# Patient Record
Sex: Male | Born: 1940 | Smoking: Never smoker
Health system: Southern US, Community
[De-identification: ages and names within clinical notes are randomized; demographics above are authoritative.]

## PROBLEM LIST (undated history)

## (undated) DIAGNOSIS — I10 Essential (primary) hypertension: Secondary | ICD-10-CM

---

## 2009-04-05 ENCOUNTER — Emergency Department: Payer: Self-pay | Admitting: Emergency Medicine

## 2013-07-11 ENCOUNTER — Ambulatory Visit: Payer: Self-pay | Admitting: Urology

## 2013-12-15 ENCOUNTER — Ambulatory Visit: Payer: Self-pay | Admitting: Urology

## 2013-12-15 LAB — BASIC METABOLIC PANEL
Anion Gap: 6 — ABNORMAL LOW (ref 7–16)
BUN: 15 mg/dL (ref 7–18)
CHLORIDE: 98 mmol/L (ref 98–107)
CO2: 28 mmol/L (ref 21–32)
Calcium, Total: 9.4 mg/dL (ref 8.5–10.1)
Creatinine: 0.79 mg/dL (ref 0.60–1.30)
EGFR (Non-African Amer.): >60
Glucose: 101 mg/dL — ABNORMAL HIGH (ref 65–99)
Osmolality: 265 (ref 275–301)
Potassium: 3.7 mmol/L (ref 3.5–5.1)
Sodium: 132 mmol/L — ABNORMAL LOW (ref 136–145)

## 2013-12-29 ENCOUNTER — Ambulatory Visit: Payer: Self-pay | Admitting: Urology

## 2013-12-30 ENCOUNTER — Emergency Department: Payer: Self-pay | Admitting: Emergency Medicine

## 2013-12-30 LAB — COMPREHENSIVE METABOLIC PANEL
Albumin: 3.6 g/dL (ref 3.4–5.0)
Alkaline Phosphatase: 68 U/L
Anion Gap: 11 (ref 7–16)
BUN: 16 mg/dL (ref 7–18)
Bilirubin,Total: 0.4 mg/dL (ref 0.2–1.0)
CREATININE: 1.22 mg/dL (ref 0.60–1.30)
Calcium, Total: 9.1 mg/dL (ref 8.5–10.1)
Chloride: 103 mmol/L (ref 98–107)
Co2: 24 mmol/L (ref 21–32)
EGFR (African American): >60
Glucose: 105 mg/dL — ABNORMAL HIGH (ref 65–99)
OSMOLALITY: 277 (ref 275–301)
Potassium: 3.4 mmol/L — ABNORMAL LOW (ref 3.5–5.1)
SGOT(AST): 27 U/L (ref 15–37)
SGPT (ALT): 25 U/L (ref 12–78)
Sodium: 138 mmol/L (ref 136–145)
TOTAL PROTEIN: 7.8 g/dL (ref 6.4–8.2)

## 2013-12-30 LAB — CBC
HCT: 42.3 % (ref 40.0–52.0)
HGB: 13.5 g/dL (ref 13.0–18.0)
MCH: 26.9 pg (ref 26.0–34.0)
MCHC: 31.9 g/dL — ABNORMAL LOW (ref 32.0–36.0)
MCV: 84 fL (ref 80–100)
PLATELETS: 201 10*3/uL (ref 150–440)
RBC: 5.02 10*6/uL (ref 4.40–5.90)
RDW: 15.8 % — ABNORMAL HIGH (ref 11.5–14.5)
WBC: 3.5 10*3/uL — ABNORMAL LOW (ref 3.8–10.6)

## 2013-12-30 LAB — URINALYSIS, COMPLETE
Bilirubin,UR: NEGATIVE
Glucose,UR: NEGATIVE mg/dL (ref 0–75)
Nitrite: NEGATIVE
PROTEIN: NEGATIVE
Ph: 5 (ref 4.5–8.0)
Specific Gravity: 1.018 (ref 1.003–1.030)
Squamous Epithelial: NONE SEEN

## 2013-12-30 LAB — TROPONIN I: TROPONIN-I: 0.04 ng/mL

## 2013-12-30 LAB — PROTIME-INR
INR: 1
Prothrombin Time: 12.6 secs (ref 11.5–14.7)

## 2013-12-30 LAB — APTT: Activated PTT: 27.9 secs (ref 23.6–35.9)

## 2013-12-30 LAB — LIPASE, BLOOD: Lipase: 113 U/L (ref 73–393)

## 2014-08-12 IMAGING — CT CT ABD-PELV W/ CM
2 of 5 series · 15 of 46 positions shown, 17 images · IV contrast (isovue)
Comparison: CT of the abdomen and pelvis performed 07/11/2013

CLINICAL DATA: Near syncope. Hypotension. Epigastric abdominal
pain.

EXAM:
CT ABDOMEN AND PELVIS WITH CONTRAST
TECHNIQUE: Multidetector CT imaging of the abdomen and pelvis was performed
using the standard protocol following bolus administration of
intravenous contrast.
CONTRAST:  100 mL of Isovue 300 IV contrast

[Series 2: routine abd pel with · axial · 0.81mm/px · z∈[-590,-146]mm · 12 of 101 slices shown, 14 images]
[im 6/101  soft-tissue]
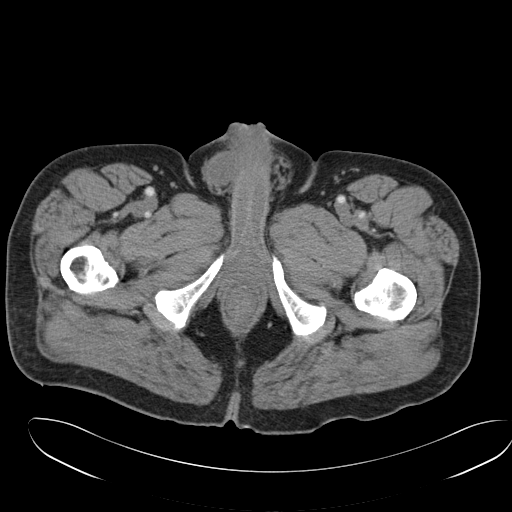
[im 6/101  bone]
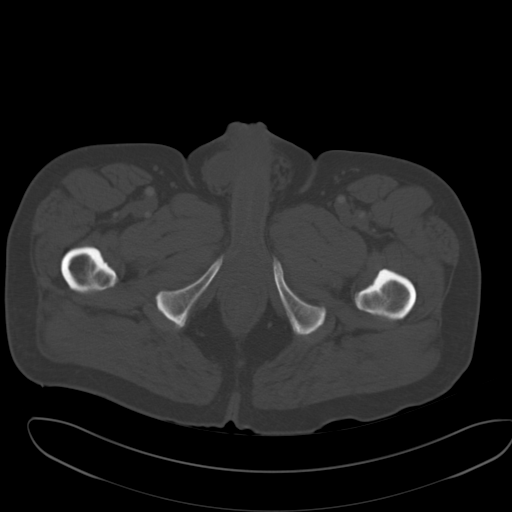
[im 18/101  soft-tissue]
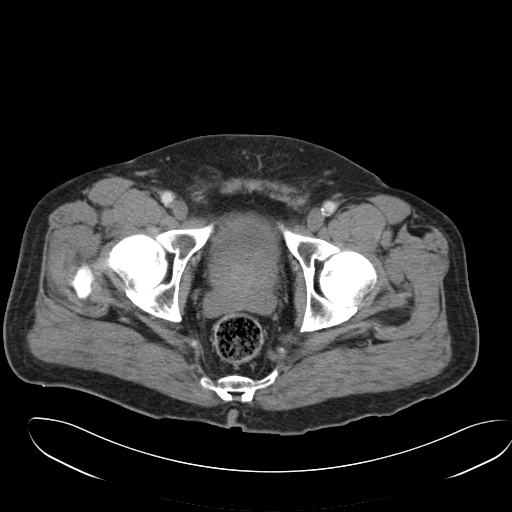
[im 24/101  soft-tissue]
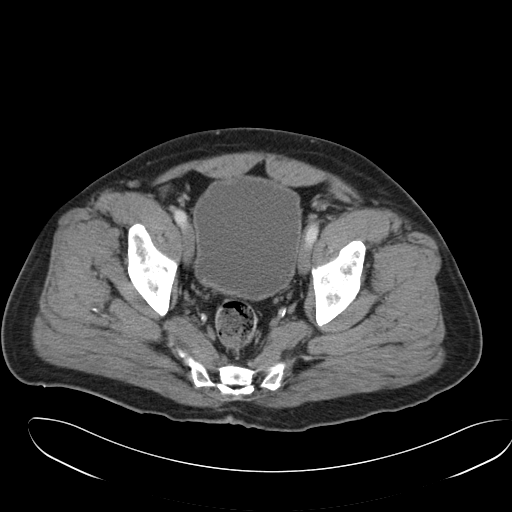
[im 30/101  soft-tissue]
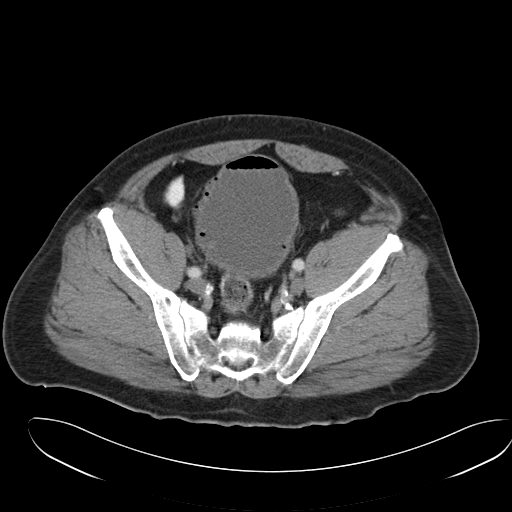
[im 42/101  soft-tissue]
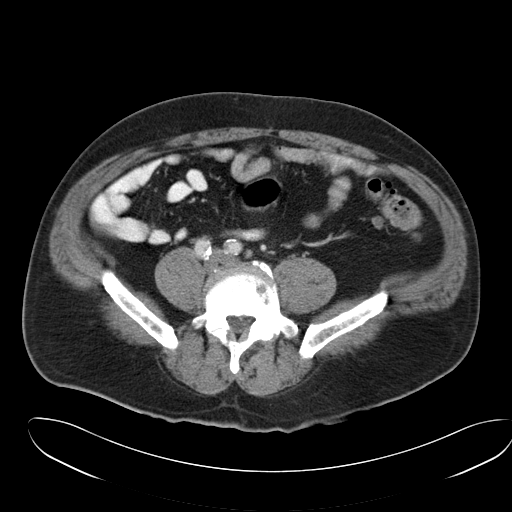
[im 48/101  soft-tissue]
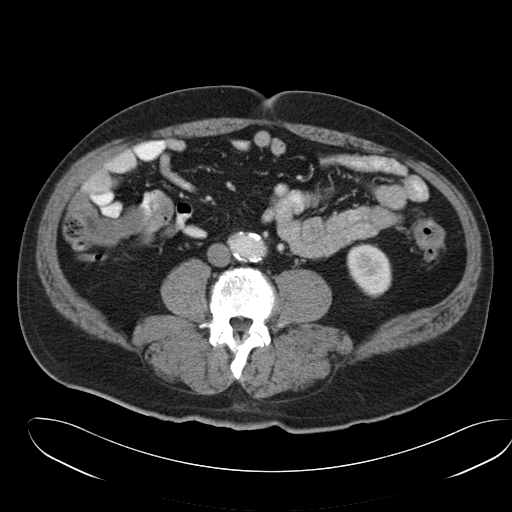
[im 53/101  soft-tissue]
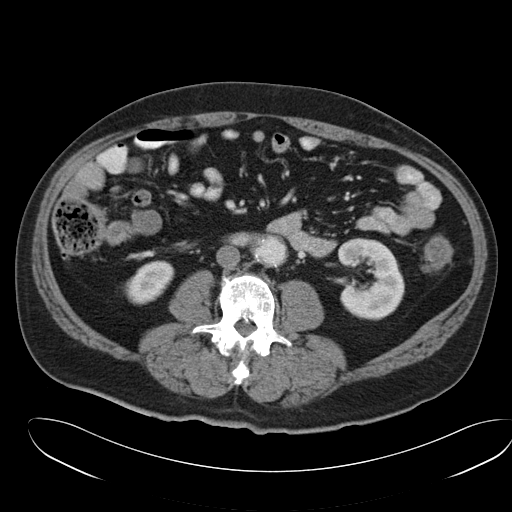
[im 65/101  soft-tissue]
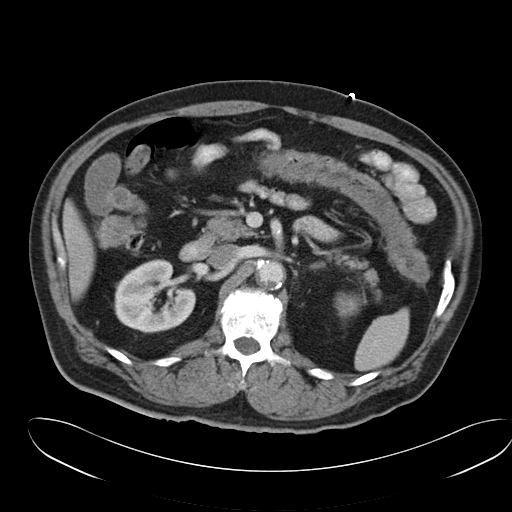
[im 71/101  soft-tissue]
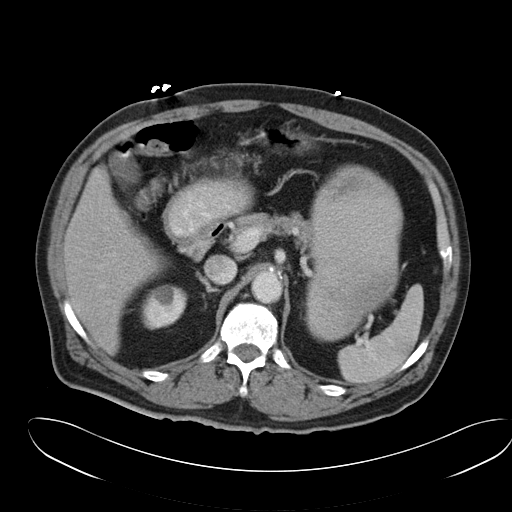
[im 71/101  bone]
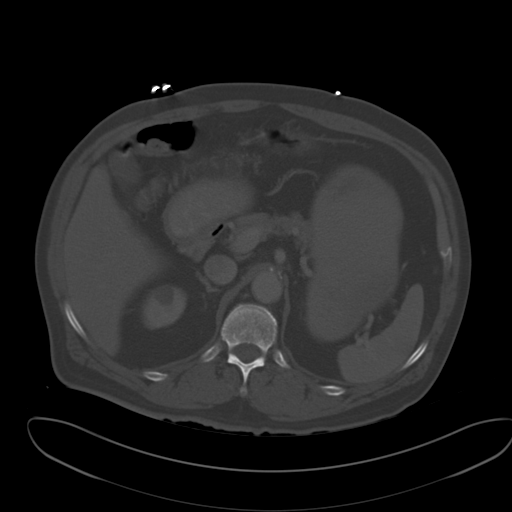
[im 77/101  soft-tissue]
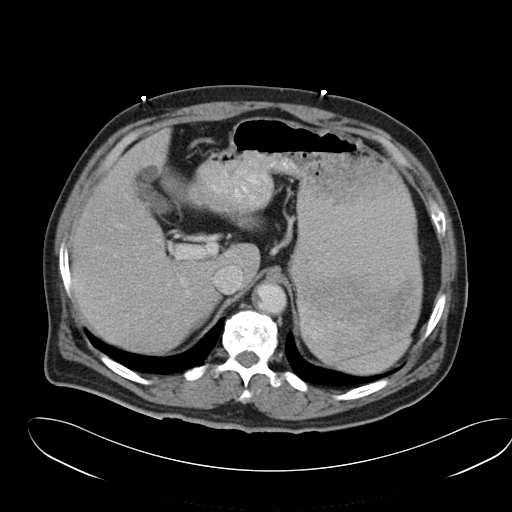
[im 89/101  soft-tissue]
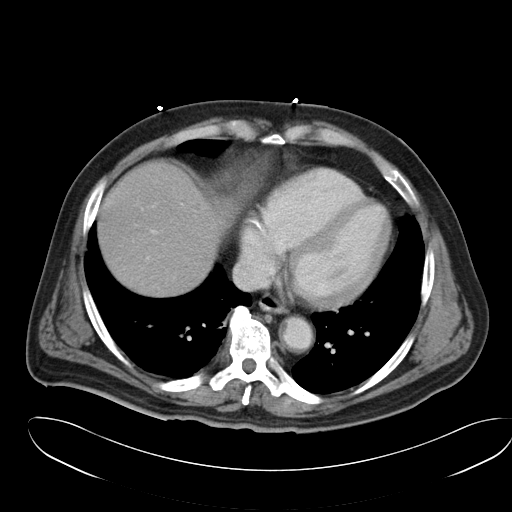
[im 95/101  soft-tissue]
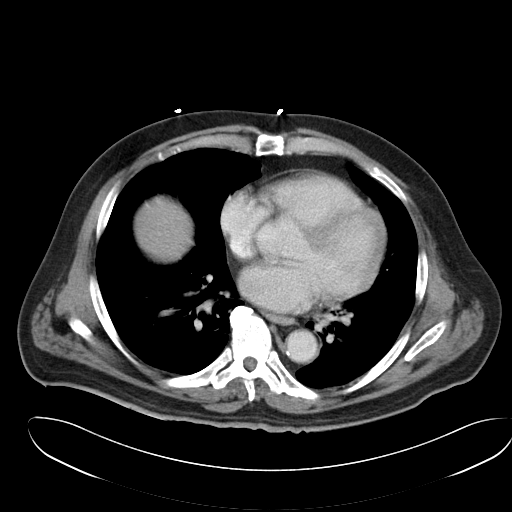

[Series 6: cor routine abd pel with · coronal · 1.03mm/px · 3 of 133 slices shown]
[im 45/133  soft-tissue]
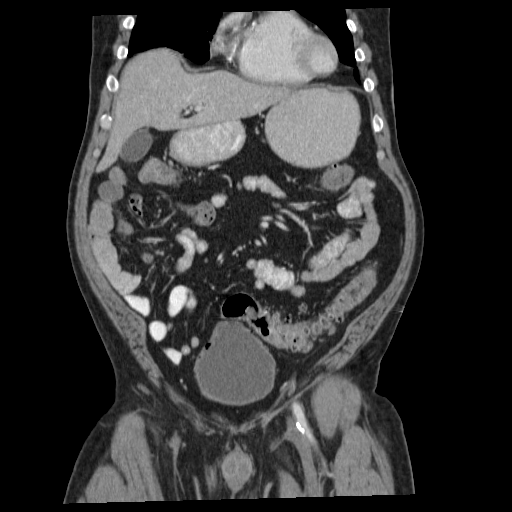
[im 59/133  soft-tissue]
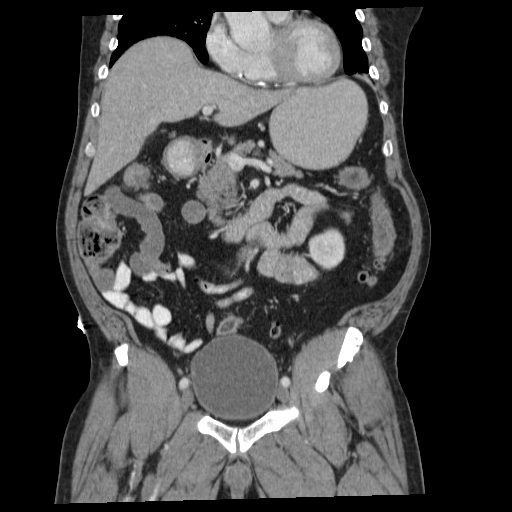
[im 74/133  soft-tissue]
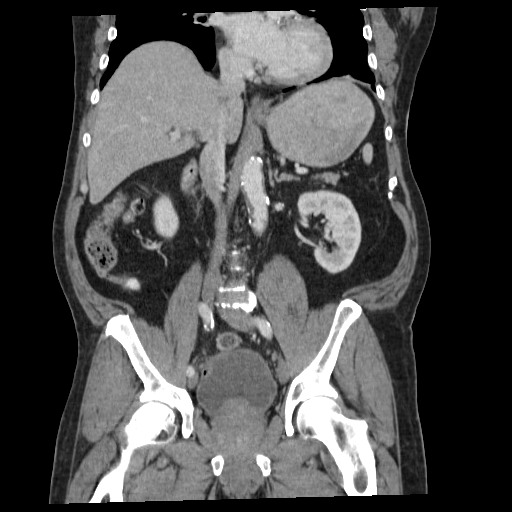

[15 of 46 positions shown; findings below may reference images not displayed]

FINDINGS: Minimal bibasilar atelectasis or scarring is noted. Scattered
coronary artery calcifications are seen.

Tiny scattered hypodensities within the liver are nonspecific but
appear grossly stable and may reflect tiny cysts. A 1.5 cm cyst is
noted along the gallbladder fossa. The spleen is unremarkable in
appearance. The gallbladder is within normal limits. The pancreas
and adrenal glands are unremarkable.

Scattered small bilateral renal cysts are seen, measuring up to
cm in size. Nonspecific perinephric stranding is noted bilaterally.
The kidneys are otherwise unremarkable in appearance. There is no
evidence of hydronephrosis. No renal or ureteral stones are seen.

No free fluid is identified. The small bowel is unremarkable in
appearance. The stomach is within normal limits. No acute vascular
abnormalities are seen. Mild scattered calcification is seen along
the abdominal aorta and its branches, with mild associated mural
thrombus. There is mild ectasia of the distal abdominal aorta,
without evidence of aneurysmal dilatation.

The appendix is normal in caliber, without evidence for
appendicitis.

Note is made of mild wall thickening along the transverse and
descending colon, with associated mild soft tissue inflammation,
raising concern for mild colitis, either infectious or inflammatory
in nature. Diffuse diverticulosis is noted along the distal
descending and proximal sigmoid colon, without evidence of
diverticulitis.

The bladder is mildly distended. Scattered foci of air are seen
about the periphery of the bladder, raising suspicion for
emphysematous cystitis. A relatively large amount of air is seen at
the bladder dome. The prostate is enlarged, measuring 5.9 cm in
transverse dimension, and extends into the base of the bladder. No
inguinal lymphadenopathy is seen. A small left-sided hydrocele is
partially imaged.

No acute osseous abnormalities are identified.
IMPRESSION: 1. Mild diffuse wall thickening along the transverse and descending
colon, with associated mild soft tissue inflammation, raising
concern for mild acute colitis, either infectious or inflammatory in
nature.
2. Scattered foci of air about the periphery of the bladder, raising
concern for emphysematous cystitis. Relatively large amount of air
seen at the bladder dome. Would correlate for recent Foley catheter
placement.
3. Enlarged prostate noted, extending into the base of the bladder.
Would consider correlation with PSA.
4. Scattered small bilateral renal cysts seen. Likely tiny hepatic
cysts.
5. Mild scattered calcification along the abdominal aorta and its
branches, with mild associated mural thrombus.
6. Diffuse diverticulosis along the distal descending and proximal
sigmoid colon, without evidence of diverticulitis.
7. Small left hydrocele noted.
8. Scattered coronary artery calcifications seen.

## 2014-12-26 NOTE — Op Note (Signed)
PATIENT NAME:  Luis Leonard, Luis Leonard MR#:  [id number] DATE OF BIRTH:  [birth year]  DATE OF PROCEDURE:  12/29/2013  PREOPERATIVE DIAGNOSIS: Severe phimosis partial penectomy  .   POSTOPERATIVE DIAGNOSIS: Severe phimosis partial penectomy  PROCEDURE: Dorsal slit with cystoscopy.   ANESTHESIA: General with a circumferential penile block.   COMPLICATIONS: None.   ESTIMATED BLOOD LOSS: None.   With the patient sterilely prepped and draped in supine position I grasped the foreskin of this partially amputated penile stump. There is severe phimosis such that I could not get a scope into the opening. I am able to see a pinhole opening, and enlarged that with a dorsal slit utilizing a straight stat and scissors dissection. Then, the opening is large enough to easily admit a scope. I put the scope over an 0.036 Glidewire and the Glidewire goes in to the bladder, so I remove the Glidewire after I placed the flexible scope with normal saline irrigating solution down the urethra into the bladder. The prostate is not enlarged at all and looks resected. The bladder itself shows no tumor, mass or growth, smooth, there is some trabeculation there. Other than that everything appears normal. The area of the dorsal, the edges are reanastomose with a running 4-0 Vicryl suture and this creates a good field. There is no bleeding. Drain the bladder with a 20 [place] Foley so now he has a good opening to void through, and he is sent to recovery with an empty bladder in satisfactory condition.   ____________________________ Caralyn Guileichard D. Edwyna ShellHart, DO rdh:sg D: 12/29/2013 09:44:34 ET T: 12/29/2013 10:04:25 ET JOB#: [id number]  cc: Caralyn Guileichard D. Edwyna ShellHart, DO, <Dictator> RICHARD D HART DO ELECTRONICALLY SIGNED 01/02/2014 8:18

## 2015-08-26 ENCOUNTER — Emergency Department
Admission: EM | Admit: 2015-08-26 | Discharge: 2015-08-26 | Disposition: A | Discharge status: Home / Self Care | Payer: Medicare Other | Attending: Emergency Medicine | Admitting: Emergency Medicine

## 2015-08-26 DIAGNOSIS — Z79899 Other long term (current) drug therapy: Secondary | ICD-10-CM | POA: Diagnosis not present

## 2015-08-26 DIAGNOSIS — I10 Essential (primary) hypertension: Secondary | ICD-10-CM | POA: Insufficient documentation

## 2015-08-26 DIAGNOSIS — N390 Urinary tract infection, site not specified: Secondary | ICD-10-CM

## 2015-08-26 DIAGNOSIS — R319 Hematuria, unspecified: Secondary | ICD-10-CM | POA: Diagnosis present

## 2015-08-26 HISTORY — DX: Essential (primary) hypertension: I10

## 2015-08-26 LAB — CBC
HCT: 45.7 % (ref 40.0–52.0)
HEMOGLOBIN: 14.7 g/dL (ref 13.0–18.0)
MCH: 27.2 pg (ref 26.0–34.0)
MCHC: 32.1 g/dL (ref 32.0–36.0)
MCV: 84.7 fL (ref 80.0–100.0)
Platelets: 302 10*3/uL (ref 150–440)
RBC: 5.4 MIL/uL (ref 4.40–5.90)
RDW: 15.8 % — ABNORMAL HIGH (ref 11.5–14.5)
WBC: 6.8 10*3/uL (ref 3.8–10.6)

## 2015-08-26 LAB — BASIC METABOLIC PANEL
ANION GAP: 7 (ref 5–15)
BUN: 16 mg/dL (ref 6–20)
CALCIUM: 9.4 mg/dL (ref 8.9–10.3)
CHLORIDE: 99 mmol/L — AB (ref 101–111)
CO2: 27 mmol/L (ref 22–32)
Creatinine, Ser: 1.04 mg/dL (ref 0.61–1.24)
GFR calc non Af Amer: >60 mL/min (ref >60)
Glucose, Bld: 107 mg/dL — ABNORMAL HIGH (ref 65–99)
Potassium: 3.5 mmol/L (ref 3.5–5.1)
SODIUM: 133 mmol/L — AB (ref 135–145)

## 2015-08-26 LAB — URINALYSIS COMPLETE WITH MICROSCOPIC (ARMC ONLY)
BILIRUBIN URINE: NEGATIVE
GLUCOSE, UA: NEGATIVE mg/dL
Ketones, ur: NEGATIVE mg/dL
Nitrite: NEGATIVE
Protein, ur: 100 mg/dL — AB
Specific Gravity, Urine: 1.013 (ref 1.005–1.030)
pH: 6 (ref 5.0–8.0)

## 2015-08-26 MED ORDER — ACETAMINOPHEN 325 MG PO TABS
ORAL_TABLET | ORAL | Status: AC
  Administered 2015-08-26: 650 mg via ORAL
  Filled 2015-08-26: qty 2

## 2015-08-26 MED ORDER — ACETAMINOPHEN 325 MG PO TABS
650.0000 mg | ORAL_TABLET | Freq: Once | ORAL | Status: AC
  Administered 2015-08-26: 650 mg via ORAL

## 2015-08-26 MED ORDER — CEPHALEXIN 500 MG PO CAPS: 500.0000 mg | ORAL_CAPSULE | Freq: Three times a day (TID) | ORAL | Status: AC

## 2015-08-26 MED ORDER — DEXTROSE 5 % IV SOLN
1.0000 g | Freq: Once | INTRAVENOUS | Status: AC
  Administered 2015-08-26: 1 g via INTRAVENOUS
  Filled 2015-08-26: qty 10

## 2015-08-26 NOTE — ED Notes (Signed)
AAOx3.  Skin warm and dry.  Ambulates with easy and steady gait. NAD 

## 2015-08-26 NOTE — ED Provider Notes (Signed)
[facility] Emergency Department Provider Note  Time seen: 2:22 PM  I have reviewed the triage vital signs and the nursing notes.   HISTORY  Chief Complaint Hematuria    HPI Luis PentonCharles Leonard is a 74 y.o. male with a past medical history of hypertension, gastric reflux, BPH, presents the emergency department bloody urine. According to the patient he has a history of urinary tract infections intermittently over the past several years. He also had a partial penectomy for penile cancer. Patient states over the past 24 hours he has noted blood in his urine, and a burning sensation when he urinates. Denies any nausea, vomiting, fever, diarrhea black or bloody stool. Denies any other bleeding such as bleeding in his gums.     Past Medical History  Diagnosis Date  . Hypertension     There are no active problems to display for this patient.   History reviewed. No pertinent past surgical history.  Current Outpatient Rx  Name  Route  Sig  Dispense  Refill  . amLODipine (NORVASC) 2.5 MG tablet   Oral   Take 2.5 mg by mouth daily.          . enalapril (VASOTEC) 20 MG tablet   Oral   Take 20 mg by mouth 2 (two) times daily.          . hydrochlorothiazide (HYDRODIURIL) 25 MG tablet   Oral   Take 25 mg by mouth daily.          Marland Kitchen. omeprazole (PRILOSEC) 20 MG capsule   Oral   Take 20 mg by mouth daily.            Allergies Review of patient's allergies indicates no known allergies.  No family history on file.  Social History Social History  Substance Use Topics  . Smoking status: Never Smoker   . Smokeless tobacco: None  . Alcohol Use: No    Review of Systems Constitutional: Negative for fever. Cardiovascular: Negative for chest pain. Respiratory: Negative for shortness of breath. Gastrointestinal: Negative for abdominal pain, vomiting and diarrhea Genitourinary: Positive for dysuria, burning sensation, positive for  hematuria. Musculoskeletal: Negative for back pain. Neurological: Negative for headache 10-point ROS otherwise negative.  ____________________________________________   PHYSICAL EXAM:  VITAL SIGNS: ED Triage Vitals  Enc Vitals Group     BP 08/26/15 1248 214/97 mmHg     Pulse Rate 08/26/15 1247 96     Resp 08/26/15 1247 20     Temp 08/26/15 1247 98.3 F (36.8 C)     Temp Source 08/26/15 1247 Oral     SpO2 08/26/15 1247 97 %     Weight 08/26/15 1247 195 lb (88.451 kg)     Height 08/26/15 1247 5\' 10"  (1.778 m)     Head Cir --      Peak Flow --      Pain Score 08/26/15 1417 0     Pain Loc --      Pain Edu? --      Excl. in GC? --     Constitutional: Alert and oriented. Well appearing and in no distress. Eyes: Normal exam ENT   Head: Normocephalic and atraumatic.   Mouth/Throat: Mucous membranes are moist. Cardiovascular: Normal rate, regular rhythm. No murmur Respiratory: Normal respiratory effort without tachypnea nor retractions. Breath sounds are clear Gastrointestinal: Soft and nontender. No distention.  There is no CVA tenderness. Musculoskeletal: Nontender with normal range of motion in all extremities. Neurologic:  Normal speech and language.  No gross focal neurologic deficits Skin:  Skin is warm, dry and intact.  Psychiatric: Mood and affect are normal. Speech and behavior are normal.   ____________________________________________ EKG reviewed and interpreted by myself shows normal sinus rhythm at 87 bpm, narrow QRS, normal axis, normal intervals, normal EKG.     INITIAL IMPRESSION / ASSESSMENT AND PLAN / ED COURSE  Pertinent labs & imaging results that were available during my care of the patient were reviewed by me and considered in my medical decision making (see chart for details).  Patient presents to the emergency department with dysuria, hematuria 24 hours. Patient appears very well currently, nontender abdominal exam, no CVA tenderness. Afebrile  in the emergency department. Patient is hypertensive, discussed this with the patient will follow up with his primary care physician regarding his hypertensive medications. Patient's urinalysis consistent with urinary tract infection, labs are otherwise at his baseline. We'll treat the patient with Rocephin, send a urine culture, and closely monitor in the emergency department. Plan to discharge the patient home on antibiotics with primary care follow-up. Patient agreeable. I've also discussed follow-up with a urologist, patient will follow up with Dr. Sheppard Penton.  I have sent a urine culture. We'll discharge the patient home on Keflex 3 times a day, until the culture results.  ____________________________________________   FINAL CLINICAL IMPRESSION(S) / ED DIAGNOSES  Urinary tract infection Hematuria   Minna Antis, MD 08/26/15 [phone]

## 2015-08-26 NOTE — ED Notes (Signed)
Blood in urine this AM. Dark in color per patient. Denies pain with urination. Pt alert and oriented X4, active, cooperative, pt in NAD. RR even and unlabored, color WNL.

## 2015-08-26 NOTE — Discharge Instructions (Signed)
Please take your antibiotics as prescribed for their entire course. Please follow-up with urology by calling the number provided to obtain a follow-up appointment as soon as possible. Return to the emergency department for any worsening pain when he urinates, abdominal pain, fever, nausea, vomiting, or any other symptom personally concerning to your self.   Urinary Tract Infection A urinary tract infection (UTI) can occur any place along the urinary tract. The tract includes the kidneys, ureters, bladder, and urethra. A type of germ called bacteria often causes a UTI. UTIs are often helped with antibiotic medicine.  HOME CARE   If given, take antibiotics as told by your doctor. Finish them even if you start to feel better.  Drink enough fluids to keep your pee (urine) clear or pale yellow.  Avoid tea, drinks with caffeine, and bubbly (carbonated) drinks.  Pee often. Avoid holding your pee in for a long time.  Pee before and after having sex (intercourse).  Wipe from front to back after you poop (bowel movement) if you are a woman. Use each tissue only once. GET HELP RIGHT AWAY IF:   You have back pain.  You have lower belly (abdominal) pain.  You have chills.  You feel sick to your stomach (nauseous).  You throw up (vomit).  Your burning or discomfort with peeing does not go away.  You have a fever.  Your symptoms are not better in 3 days. MAKE SURE YOU:   Understand these instructions.  Will watch your condition.  Will get help right away if you are not doing well or get worse.   This information is not intended to replace advice given to you by your health care provider. Make sure you discuss any questions you have with your health care provider.   Document Released: 02/07/2008 Document Revised: 09/11/2014 Document Reviewed: 03/21/2012 Elsevier Interactive Patient Education [employer]2016 [employer]

## 2015-08-28 LAB — URINE CULTURE

## 2015-11-16 ENCOUNTER — Encounter: Payer: Self-pay | Admitting: Emergency Medicine

## 2015-11-16 ENCOUNTER — Emergency Department: Payer: Medicare Other

## 2015-11-16 ENCOUNTER — Emergency Department
Admission: EM | Admit: 2015-11-16 | Discharge: 2015-11-16 | Disposition: A | Discharge status: Home / Self Care | Payer: Medicare Other | Attending: Emergency Medicine | Admitting: Emergency Medicine

## 2015-11-16 DIAGNOSIS — I10 Essential (primary) hypertension: Secondary | ICD-10-CM | POA: Insufficient documentation

## 2015-11-16 DIAGNOSIS — Z79899 Other long term (current) drug therapy: Secondary | ICD-10-CM | POA: Insufficient documentation

## 2015-11-16 DIAGNOSIS — K5900 Constipation, unspecified: Secondary | ICD-10-CM | POA: Diagnosis present

## 2015-11-16 DIAGNOSIS — K5901 Slow transit constipation: Secondary | ICD-10-CM | POA: Insufficient documentation

## 2015-11-16 MED ORDER — BISACODYL 5 MG PO TBEC: 5.0000 mg | DELAYED_RELEASE_TABLET | Freq: Every day | ORAL | Status: AC | PRN

## 2015-11-16 MED ORDER — MAGNESIUM CITRATE PO SOLN: 1.0000 | Freq: Once | ORAL | Status: AC

## 2015-11-16 NOTE — ED Provider Notes (Signed)
[facility] Emergency Department Provider Note  ____________________________________________    I have reviewed the triage vital signs and the nursing notes.   HISTORY  Chief Complaint Constipation    HPI Luis Leonard is a 75 y.o. male who presents with complaints of constipation. He reports over the last few weeks he has been having more and more episodes of hard stools. He has tried increasing his fiber intake and drinking prune juice with little success. Yesterday he had a small hard bowel movement. He denies abdominal pain but does feel bloated. No nausea or vomiting. No history of abdominal surgery. Positive flatus     Past Medical History  Diagnosis Date  . Hypertension     There are no active problems to display for this patient.   History reviewed. No pertinent past surgical history.  Current Outpatient Rx  Name  Route  Sig  Dispense  Refill  . amLODipine (NORVASC) 2.5 MG tablet   Oral   Take 2.5 mg by mouth daily.          . cephALEXin (KEFLEX) 500 MG capsule   Oral   Take 1 capsule (500 mg total) by mouth 3 (three) times daily.   30 capsule   0   . enalapril (VASOTEC) 20 MG tablet   Oral   Take 20 mg by mouth 2 (two) times daily.          . hydrochlorothiazide (HYDRODIURIL) 25 MG tablet   Oral   Take 25 mg by mouth daily.          Marland Kitchen. omeprazole (PRILOSEC) 20 MG capsule   Oral   Take 20 mg by mouth daily.            Allergies Review of patient's allergies indicates no known allergies.  No family history on file.  Social History Social History  Substance Use Topics  . Smoking status: Never Smoker   . Smokeless tobacco: None  . Alcohol Use: No    Review of Systems  Constitutional: Negative for fever.  Gastrointestinal: Positive bloating, no vomiting Genitourinary: Negative for dysuria. No difficulty urinating Musculoskeletal: Negative for back pain.   Psychiatric: no  anxiety    ____________________________________________   PHYSICAL EXAM:  VITAL SIGNS: ED Triage Vitals  Enc Vitals Group     BP 11/16/15 0807 180/88 mmHg     Pulse Rate 11/16/15 0807 75     Resp 11/16/15 0807 20     Temp 11/16/15 0807 97.5 F (36.4 C)     Temp Source 11/16/15 0807 Oral     SpO2 11/16/15 0807 97 %     Weight 11/16/15 0807 195 lb (88.451 kg)     Height 11/16/15 0807 5\' 7"  (1.702 m)     Head Cir --      Peak Flow --      Pain Score 11/16/15 0808 6     Pain Loc --      Pain Edu? --      Excl. in GC? --      Constitutional: Alert and oriented. Well appearing and in no distress.  Eyes: Conjunctivae are normal. No erythema or injection  Cardiovascular: Normal rate, regular rhythm.  Respiratory: Normal respiratory effort without tachypnea nor retractions. Gastrointestinal: Soft and non-tender in all quadrants. No distention. There is no CVA tenderness. Genitourinary: deferred M Neurologic:  Normal speech and language. No gross focal neurologic deficits are appreciated. Skin:  Skin is warm, dry and intact. No rash noted. Psychiatric:  Mood and affect are normal. Patient exhibits appropriate insight and judgment.  ____________________________________________    LABS (pertinent positives/negatives)  Labs Reviewed - No data to display  ____________________________________________   EKG  None  ____________________________________________    RADIOLOGY  No evidence obstruction on KUB  ____________________________________________   PROCEDURES  Procedure(s) performed: none  Critical Care performed: none  ____________________________________________   INITIAL IMPRESSION / ASSESSMENT AND PLAN / ED COURSE  Pertinent labs & imaging results that were available during my care of the patient were reviewed by me and considered in my medical decision making (see chart for details).  Patient has not started any new medications. We will check KUB  and evaluate for impaction. Exam is benign.  KUB is reassuring. We will start the patient on stool softeners  ____________________________________________   FINAL CLINICAL IMPRESSION(S) / ED DIAGNOSES  Final diagnoses:  Slow transit constipation          Jene Every, MD 11/16/15 1214

## 2015-11-16 NOTE — Discharge Instructions (Signed)
Constipation, Adult °Constipation is when a person has fewer than three bowel movements a week, has difficulty having a bowel movement, or has stools that are dry, hard, or larger than normal. As people grow older, constipation is more common. A low-fiber diet, not taking in enough fluids, and taking certain medicines may make constipation worse.  °CAUSES  °· Certain medicines, such as antidepressants, pain medicine, iron supplements, antacids, and water pills.   °· Certain diseases, such as diabetes, irritable bowel syndrome (IBS), thyroid disease, or depression.   °· Not drinking enough water.   °· Not eating enough fiber-rich foods.   °· Stress or travel.   °· Lack of physical activity or exercise.   °· Ignoring the urge to have a bowel movement.   °· Using laxatives too much.   °SIGNS AND SYMPTOMS  °· Having fewer than three bowel movements a week.   °· Straining to have a bowel movement.   °· Having stools that are hard, dry, or larger than normal.   °· Feeling full or bloated.   °· Pain in the lower abdomen.   °· Not feeling relief after having a bowel movement.   °DIAGNOSIS  °Your health care provider will take a medical history and perform a physical exam. Further testing may be done for severe constipation. Some tests may include: °· A barium enema X-ray to examine your rectum, colon, and, sometimes, your small intestine.   °· A sigmoidoscopy to examine your lower colon.   °· A colonoscopy to examine your entire colon. °TREATMENT  °Treatment will depend on the severity of your constipation and what is causing it. Some dietary treatments include drinking more fluids and eating more fiber-rich foods. Lifestyle treatments may include regular exercise. If these diet and lifestyle recommendations do not help, your health care provider may recommend taking over-the-counter laxative medicines to help you have bowel movements. Prescription medicines may be prescribed if over-the-counter medicines do not work.    °HOME CARE INSTRUCTIONS  °· Eat foods that have a lot of fiber, such as fruits, vegetables, whole grains, and beans. °· Limit foods high in fat and processed sugars, such as french fries, hamburgers, cookies, candies, and soda.   °· A fiber supplement may be added to your diet if you cannot get enough fiber from foods.   °· Drink enough fluids to keep your urine clear or pale yellow.   °· Exercise regularly or as directed by your health care provider.   °· Go to the restroom when you have the urge to go. Do not hold it.   °· Only take over-the-counter or prescription medicines as directed by your health care provider. Do not take other medicines for constipation without talking to your health care provider first.   °SEEK IMMEDIATE MEDICAL CARE IF:  °· You have bright red blood in your stool.   °· Your constipation lasts for more than 4 days or gets worse.   °· You have abdominal or rectal pain.   °· You have thin, pencil-like stools.   °· You have unexplained weight loss. °MAKE SURE YOU:  °· Understand these instructions. °· Will watch your condition. °· Will get help right away if you are not doing well or get worse. °  °This information is not intended to replace advice given to you by your health care provider. Make sure you discuss any questions you have with your health care provider. °  °Document Released: 05/19/2004 Document Revised: 09/11/2014 Document Reviewed: 06/02/2013 °[employer] Interactive Patient Education ©2016 [employer] ° °High-Fiber Diet °Fiber, also called dietary fiber, is a type of carbohydrate found in fruits, vegetables, whole grains, and   beans. A high-fiber diet can have many health benefits. Your health care provider may recommend a high-fiber diet to help: °· Prevent constipation. Fiber can make your bowel movements more regular. °· Lower your cholesterol. °· Relieve hemorrhoids, uncomplicated diverticulosis, or irritable bowel syndrome. °· Prevent overeating as part of a weight-loss  plan. °· Prevent heart disease, type 2 diabetes, and certain cancers. °WHAT IS MY PLAN? °The recommended daily intake of fiber includes: °· 38 grams for men under age 50. °· 30 grams for men over age 50. °· 25 grams for women under age 50. °· 21 grams for women over age 50. °You can get the recommended daily intake of dietary fiber by eating a variety of fruits, vegetables, grains, and beans. Your health care provider may also recommend a fiber supplement if it is not possible to get enough fiber through your diet. °WHAT DO I NEED TO KNOW ABOUT A HIGH-FIBER DIET? °· Fiber supplements have not been widely studied for their effectiveness, so it is better to get fiber through food sources. °· Always check the fiber content on the nutrition facts label of any prepackaged food. Look for foods that contain at least 5 grams of fiber per serving. °· Ask your dietitian if you have questions about specific foods that are related to your condition, especially if those foods are not listed in the following section. °· Increase your daily fiber consumption gradually. Increasing your intake of dietary fiber too quickly may cause bloating, cramping, or gas. °· Drink plenty of water. Water helps you to digest fiber. °WHAT FOODS CAN I EAT? °Grains °Whole-grain breads. Multigrain cereal. Oats and oatmeal. Brown rice. Barley. Bulgur wheat. Millet. Bran muffins. Popcorn. Rye wafer crackers. °Vegetables °Sweet potatoes. Spinach. Kale. Artichokes. Cabbage. Broccoli. Green peas. Carrots. Squash. °Fruits °Berries. Pears. Apples. Oranges. Avocados. Prunes and raisins. Dried figs. °Meats and Other Protein Sources °Navy, kidney, pinto, and soy beans. Split peas. Lentils. Nuts and seeds. °Dairy °Fiber-fortified yogurt. °Beverages °Fiber-fortified soy milk. Fiber-fortified orange juice. °Other °Fiber bars. °The items listed above may not be a complete list of recommended foods or beverages. Contact your dietitian for more options. °WHAT FOODS  ARE NOT RECOMMENDED? °Grains °White bread. Pasta made with refined flour. White rice. °Vegetables °Fried potatoes. Canned vegetables. Well-cooked vegetables.  °Fruits °Fruit juice. Cooked, strained fruit. °Meats and Other Protein Sources °Fatty cuts of meat. Fried poultry or fried fish. °Dairy °Milk. Yogurt. Cream cheese. Sour cream. °Beverages °Soft drinks. °Other °Cakes and pastries. Butter and oils. °The items listed above may not be a complete list of foods and beverages to avoid. Contact your dietitian for more information. °WHAT ARE SOME TIPS FOR INCLUDING HIGH-FIBER FOODS IN MY DIET? °· Eat a wide variety of high-fiber foods. °· Make sure that half of all grains consumed each day are whole grains. °· Replace breads and cereals made from refined flour or white flour with whole-grain breads and cereals. °· Replace white rice with brown rice, bulgur wheat, or millet. °· Start the day with a breakfast that is high in fiber, such as a cereal that contains at least 5 grams of fiber per serving. °· Use beans in place of meat in soups, salads, or pasta. °· Eat high-fiber snacks, such as berries, raw vegetables, nuts, or popcorn. °  °This information is not intended to replace advice given to you by your health care provider. Make sure you discuss any questions you have with your health care provider. °  °Document Released: 08/21/2005 Document Revised: 09/11/2014 Document   Reviewed: 02/03/2014 °[employer] Interactive Patient Education ©2016 [employer] ° °

## 2015-11-16 NOTE — ED Notes (Signed)
Thinks he has a bowel blockage       Constipation  Last bm was yesterday  Small amt only

## 2015-11-16 NOTE — ED Notes (Signed)
Discussed discharge instructions, prescriptions, and follow-up care with patient. No questions or concerns at this time. Pt stable at discharge.  

## 2016-06-27 IMAGING — CR DG ABDOMEN 1V
1 series · 2 of 2 positions shown · non-contrast
Comparison: CT abdomen and pelvis 12/31/2013

CLINICAL DATA: Constipation for several days.

EXAM:
ABDOMEN - 1 VIEW

[Series 2: t abdomen supine · 0.14mm/px · 2 of 2 slices shown]
[im 1/2]
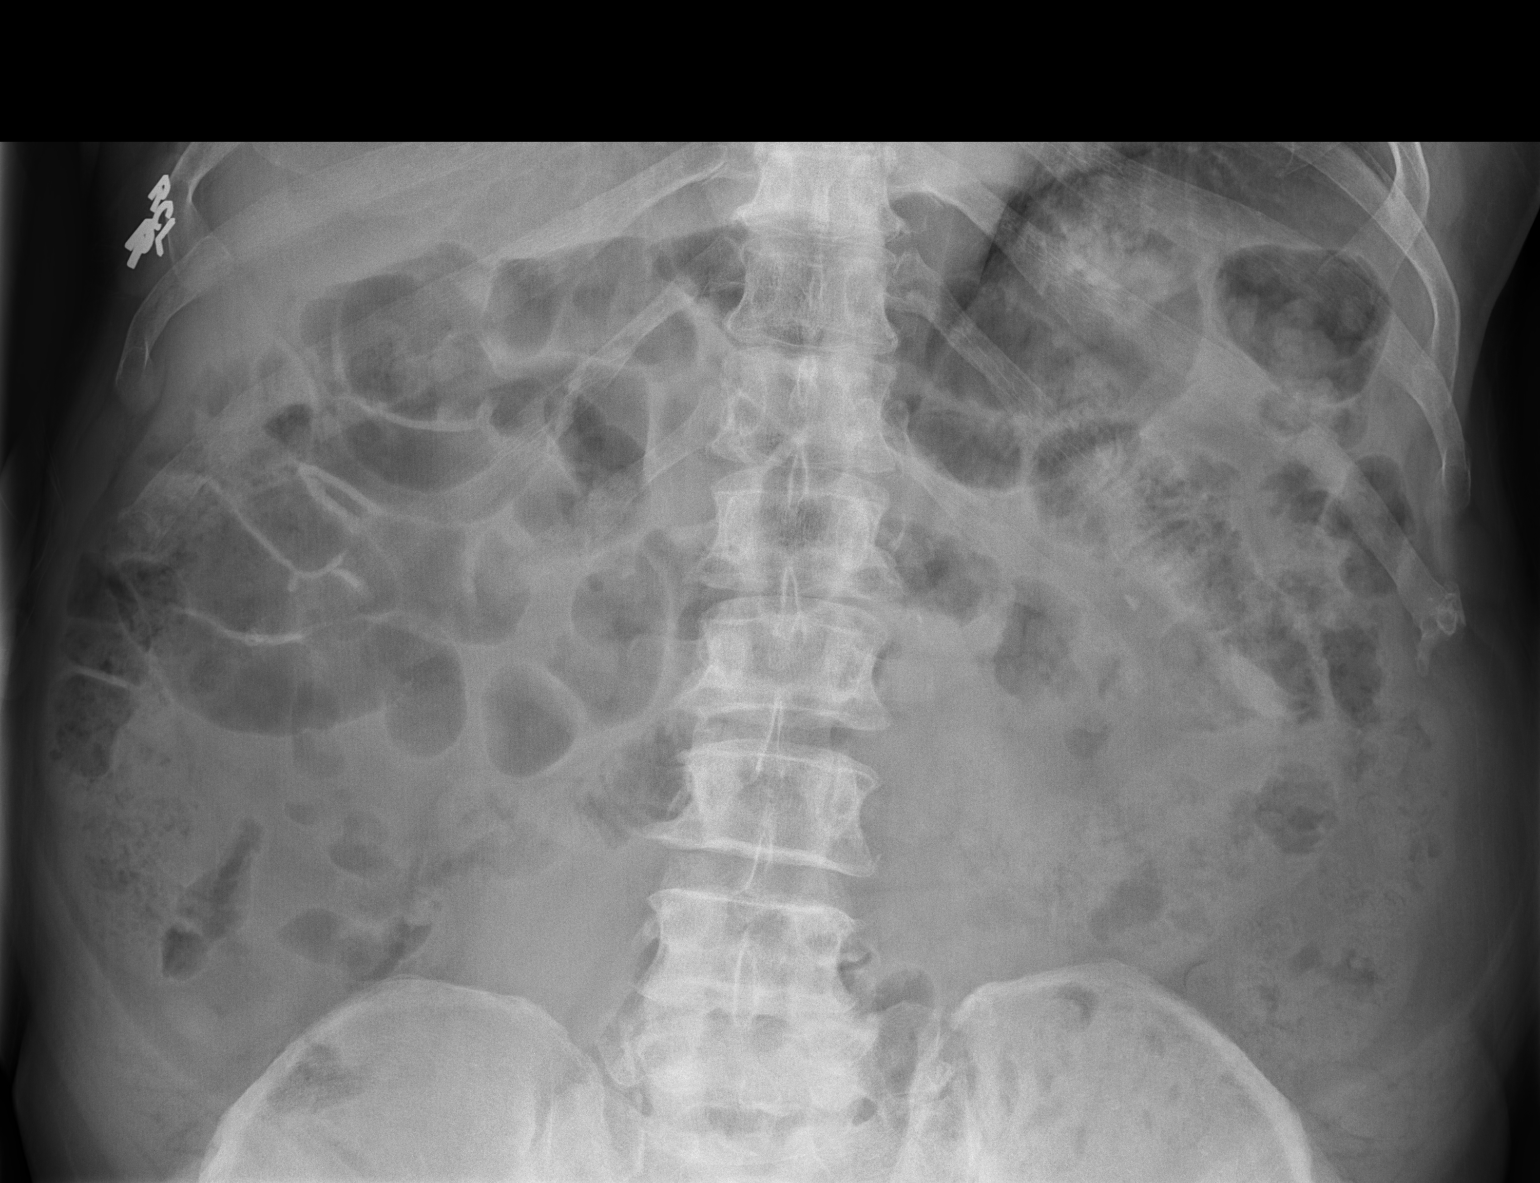
[im 2/2]
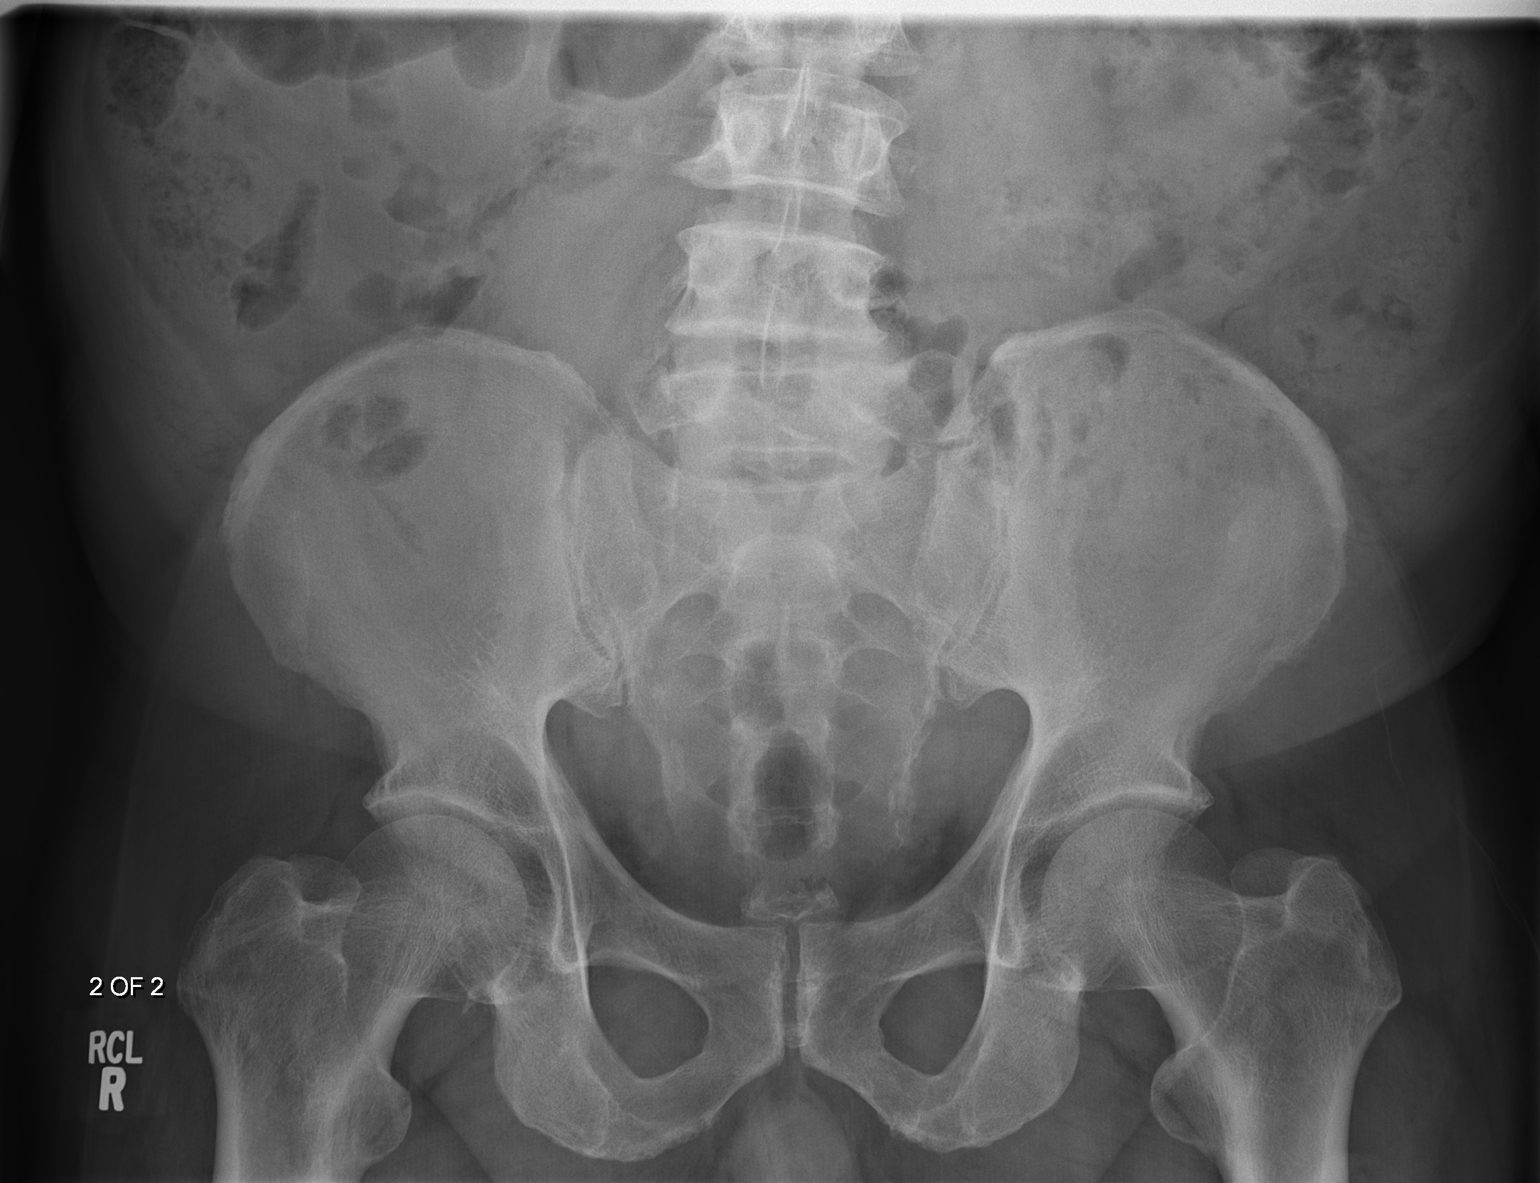

[2 of 2 positions shown; findings below may reference images not displayed]

FINDINGS: Gas is present in loops of small and large bowel to the level of the
rectum without evidence of significant bowel dilatation to indicate
obstruction. There is a small to moderate amount of stool throughout
the colon. 5 mm calcification in the left mid abdomen likely
corresponds to a vascular calcification near the left renal hilum on
prior CT. Atherosclerotic aortic calcification and lumbar
spondylosis are noted.
IMPRESSION: Small to moderate amount of colonic stool. No evidence of bowel
obstruction.
